# Patient Record
Sex: Female | Born: 1935 | Race: White | Hispanic: No | Marital: Married | Smoking: Never smoker
Health system: Southern US, Community
[De-identification: ages and names within clinical notes are randomized; demographics above are authoritative.]

## PROBLEM LIST (undated history)

## (undated) DIAGNOSIS — I110 Hypertensive heart disease with heart failure: Secondary | ICD-10-CM

## (undated) DIAGNOSIS — F0390 Unspecified dementia without behavioral disturbance: Secondary | ICD-10-CM

## (undated) DIAGNOSIS — K219 Gastro-esophageal reflux disease without esophagitis: Secondary | ICD-10-CM

## (undated) DIAGNOSIS — I10 Essential (primary) hypertension: Secondary | ICD-10-CM

## (undated) DIAGNOSIS — G629 Polyneuropathy, unspecified: Secondary | ICD-10-CM

## (undated) DIAGNOSIS — J9601 Acute respiratory failure with hypoxia: Secondary | ICD-10-CM

## (undated) DIAGNOSIS — M810 Age-related osteoporosis without current pathological fracture: Secondary | ICD-10-CM

## (undated) DIAGNOSIS — E7800 Pure hypercholesterolemia, unspecified: Secondary | ICD-10-CM

## (undated) DIAGNOSIS — F329 Major depressive disorder, single episode, unspecified: Secondary | ICD-10-CM

## (undated) DIAGNOSIS — G8929 Other chronic pain: Secondary | ICD-10-CM

## (undated) DIAGNOSIS — F419 Anxiety disorder, unspecified: Secondary | ICD-10-CM

## (undated) DIAGNOSIS — M545 Low back pain: Secondary | ICD-10-CM

## (undated) DIAGNOSIS — D649 Anemia, unspecified: Secondary | ICD-10-CM

## (undated) DIAGNOSIS — J309 Allergic rhinitis, unspecified: Secondary | ICD-10-CM

## (undated) DIAGNOSIS — J189 Pneumonia, unspecified organism: Secondary | ICD-10-CM

## (undated) DIAGNOSIS — S32050A Wedge compression fracture of fifth lumbar vertebra, initial encounter for closed fracture: Secondary | ICD-10-CM

## (undated) DIAGNOSIS — I5020 Unspecified systolic (congestive) heart failure: Secondary | ICD-10-CM

## (undated) HISTORY — DX: Polyneuropathy, unspecified: G62.9

## (undated) HISTORY — DX: Allergic rhinitis, unspecified: J30.9

## (undated) HISTORY — DX: Age-related osteoporosis without current pathological fracture: M81.0

## (undated) HISTORY — DX: Unspecified dementia without behavioral disturbance: F03.90

## (undated) HISTORY — DX: Acute respiratory failure with hypoxia: J96.01

## (undated) HISTORY — DX: Pure hypercholesterolemia, unspecified: E78.00

## (undated) HISTORY — DX: Other chronic pain: G89.29

## (undated) HISTORY — DX: Pneumonia, unspecified organism: J18.9

## (undated) HISTORY — DX: Wedge compression fracture of fifth lumbar vertebra, initial encounter for closed fracture: S32.050A

## (undated) HISTORY — DX: Unspecified dementia, unspecified severity, without behavioral disturbance, psychotic disturbance, mood disturbance, and anxiety: F03.90

## (undated) HISTORY — DX: Gastro-esophageal reflux disease without esophagitis: K21.9

## (undated) HISTORY — DX: Anxiety disorder, unspecified: F41.9

## (undated) HISTORY — DX: Hypertensive heart disease with heart failure: I11.0

## (undated) HISTORY — DX: Low back pain: M54.5

## (undated) HISTORY — DX: Unspecified systolic (congestive) heart failure: I50.20

## (undated) HISTORY — DX: Anemia, unspecified: D64.9

## (undated) HISTORY — DX: Essential (primary) hypertension: I10

## (undated) HISTORY — DX: Major depressive disorder, single episode, unspecified: F32.9

---

## 2006-11-30 ENCOUNTER — Observation Stay (HOSPITAL_COMMUNITY): Admission: RE | Admit: 2006-11-30 | Discharge: 2006-12-01 | Payer: Self-pay | Admitting: Neurological Surgery

## 2007-01-02 ENCOUNTER — Encounter: Admission: RE | Admit: 2007-01-02 | Discharge: 2007-01-02 | Payer: Self-pay | Admitting: Neurological Surgery

## 2011-01-28 ENCOUNTER — Ambulatory Visit
Admission: RE | Admit: 2011-01-28 | Discharge: 2011-01-28 | Disposition: A | Discharge status: Home / Self Care | Payer: Medicare Other | Source: Ambulatory Visit | Attending: Neurological Surgery | Admitting: Neurological Surgery

## 2011-01-28 ENCOUNTER — Other Ambulatory Visit: Payer: Self-pay | Admitting: Neurological Surgery

## 2011-01-28 DIAGNOSIS — M542 Cervicalgia: Secondary | ICD-10-CM

## 2017-07-01 LAB — HEPATIC FUNCTION PANEL
ALK PHOS: 91 (ref 25–125)
ALT: 20 (ref 7–35)
ALT: 20 (ref 7–35)
AST: 21 (ref 13–35)
AST: 21 (ref 13–35)
Alkaline Phosphatase: 91 (ref 25–125)
Bilirubin, Total: 0.7
Bilirubin, Total: 0.7

## 2017-07-03 LAB — BASIC METABOLIC PANEL
BUN: 26 — AB (ref 4–21)
BUN: 26 — AB (ref 4–21)
CREATININE: 0.8 (ref 0.5–1.1)
CREATININE: 0.8 (ref 0.5–1.1)
POTASSIUM: 3.9 (ref 3.4–5.3)
Potassium: 3.9 (ref 3.4–5.3)
SODIUM: 140 (ref 137–147)
Sodium: 140 (ref 137–147)

## 2017-07-04 LAB — CBC AND DIFFERENTIAL
HEMATOCRIT: 39 (ref 36–46)
HEMATOCRIT: 39 (ref 36–46)
Hemoglobin: 12.6 (ref 12.0–16.0)
Hemoglobin: 12.6 (ref 12.0–16.0)
PLATELETS: 268 (ref 150–399)
Platelets: 268 (ref 150–399)
WBC: 12.9
WBC: 12.9

## 2017-07-08 ENCOUNTER — Non-Acute Institutional Stay (SKILLED_NURSING_FACILITY): Payer: Medicare Other | Admitting: Internal Medicine

## 2017-07-08 ENCOUNTER — Encounter: Payer: Self-pay | Admitting: Internal Medicine

## 2017-07-08 DIAGNOSIS — M545 Low back pain, unspecified: Secondary | ICD-10-CM | POA: Insufficient documentation

## 2017-07-08 DIAGNOSIS — J9601 Acute respiratory failure with hypoxia: Secondary | ICD-10-CM

## 2017-07-08 DIAGNOSIS — M810 Age-related osteoporosis without current pathological fracture: Secondary | ICD-10-CM

## 2017-07-08 DIAGNOSIS — F32A Depression, unspecified: Secondary | ICD-10-CM | POA: Insufficient documentation

## 2017-07-08 DIAGNOSIS — F329 Major depressive disorder, single episode, unspecified: Secondary | ICD-10-CM

## 2017-07-08 DIAGNOSIS — G629 Polyneuropathy, unspecified: Secondary | ICD-10-CM | POA: Insufficient documentation

## 2017-07-08 DIAGNOSIS — G8929 Other chronic pain: Secondary | ICD-10-CM

## 2017-07-08 DIAGNOSIS — G301 Alzheimer's disease with late onset: Secondary | ICD-10-CM | POA: Diagnosis not present

## 2017-07-08 DIAGNOSIS — I5020 Unspecified systolic (congestive) heart failure: Secondary | ICD-10-CM | POA: Insufficient documentation

## 2017-07-08 DIAGNOSIS — I5022 Chronic systolic (congestive) heart failure: Secondary | ICD-10-CM | POA: Diagnosis not present

## 2017-07-08 DIAGNOSIS — J189 Pneumonia, unspecified organism: Secondary | ICD-10-CM

## 2017-07-08 DIAGNOSIS — I110 Hypertensive heart disease with heart failure: Secondary | ICD-10-CM

## 2017-07-08 DIAGNOSIS — J181 Lobar pneumonia, unspecified organism: Secondary | ICD-10-CM

## 2017-07-08 DIAGNOSIS — F0280 Dementia in other diseases classified elsewhere without behavioral disturbance: Secondary | ICD-10-CM

## 2017-07-08 DIAGNOSIS — F0390 Unspecified dementia without behavioral disturbance: Secondary | ICD-10-CM | POA: Insufficient documentation

## 2017-07-08 HISTORY — DX: Other chronic pain: G89.29

## 2017-07-08 HISTORY — DX: Hypertensive heart disease with heart failure: I11.0

## 2017-07-08 HISTORY — DX: Polyneuropathy, unspecified: G62.9

## 2017-07-08 HISTORY — DX: Unspecified dementia, unspecified severity, without behavioral disturbance, psychotic disturbance, mood disturbance, and anxiety: F03.90

## 2017-07-08 HISTORY — DX: Depression, unspecified: F32.A

## 2017-07-08 HISTORY — DX: Acute respiratory failure with hypoxia: J96.01

## 2017-07-08 HISTORY — DX: Pneumonia, unspecified organism: J18.9

## 2017-07-08 HISTORY — DX: Low back pain, unspecified: M54.50

## 2017-07-08 HISTORY — DX: Unspecified systolic (congestive) heart failure: I50.20

## 2017-07-08 NOTE — Progress Notes (Signed)
  Opened in error. Disregard.   This encounter was created in error - please disregard.

## 2017-07-11 ENCOUNTER — Encounter: Payer: Self-pay | Admitting: Internal Medicine

## 2017-07-11 NOTE — Progress Notes (Signed)
Provider:  Margit Hanks M.D. Location:  [facility]   Place of Service:  SNF (31)  PCP: No primary care provider on file. No care team member to display  Extended Emergency Contact Information Primary Emergency Contact: Burnadette [place] Mobile Phone: [phone] Relation: Spouse Secondary Emergency Contact: Burby,Chris  [place] Mobile Phone: [phone] Relation: Daughter     Allergies: Codeine; Morphine and related; Pravachol [pravastatin sodium]; Risperdal [risperidone]; and Tramadol  Chief Complaint  Patient presents with  . New Admit To SNF    Admit to Facility    HPI: Patient is 83 y.o. female with advanced dementia, hypertension, hyperlipidemia, who lives with her family at home. Before presentation to [facility] ED patient Began complaining of shortness of breath and has had subjective fevers. Her oxygenation was checked and found to be less than 90. Patient continued to have shortness of breath during the night, screaming all night as reported by family and having subjective fever. She was brought to the emergency department for continued symptoms. In the ED patient was found to have bilateral basal opacities suggestive of pneumonia and she was started on pneumonia protocol. Per husband who gave a history patient did not complain of any chest pain or cough but did complain of shortness of breath. She denied any palpitations abdominal pain dysuria nausea vomiting or diarrhea. Patient was admitted to [facility] from 8/10-15 for pneumonia treated with Zithromax and Rocephin. Hospital course was, graded about possible systolic congestive heart failure and patient was started on heart failure regimen with improvement. Patient also was found to have dysphasia to be treated with soft diet and nectar thick liquid. Patient had a chronic left heel ulcer stage III and it was not infected.  Patient continued to have leukocytosis despite treatment. However all other parameters improved. Patient is admitted to skilled nursing facility for generalized weakness for OT/PT. While at skilled nursing facility patient will be treated for hyperlipidemia and treated with Lipitor, dementia treated with Aricept, and polyneuropathy treated with Neurontin.  Past Medical History:  Diagnosis Date  . Allergic rhinitis   . Anemia   . Anxiety   . Compression fracture of fifth lumbar vertebra (HCC)   . Dementia   . GERD (gastroesophageal reflux disease)   . Hypercholesteremia   . Hypertension   . Osteoporosis     History reviewed. No pertinent surgical history.  Allergies as of 07/08/2017      Reactions   Codeine    Morphine And Related    Pravachol [pravastatin Sodium]    Risperdal [risperidone]    Tramadol       Medication List       Accurate as of 07/08/17 11:59 PM. Always use your most recent med list.          atorvastatin 20 MG tablet Commonly known as:  LIPITOR Take 20 mg by mouth daily.   bisacodyl 10 MG suppository Commonly known as:  DULCOLAX Place 10 mg rectally daily as needed.   carbamide peroxide 6.5 % OTIC solution Commonly known as:  DEBROX Place 3-4 drops into both ears once a week.   cefdinir 300 MG capsule Commonly known as:  OMNICEF Take 300 mg by mouth 2 (two) times daily. For 7 days   donepezil 5 MG tablet Commonly known as:  ARICEPT Take 5 mg by mouth at bedtime.   furosemide 20 MG tablet Commonly known as:  LASIX Take 40  mg by mouth daily.   gabapentin 300 MG capsule Commonly known as:  NEURONTIN Take 900 mg by mouth at bedtime. 3 caps   ibandronate 150 MG tablet Commonly known as:  BONIVA Take 150 mg by mouth every 30 (thirty) days. Take in the morning with a full glass of water, on an empty stomach, and do not take anything else by mouth or lie down for the next 30 min.   lisinopril 40 MG tablet Commonly known as:   PRINIVIL,ZESTRIL Take 40 mg by mouth daily.   metoprolol tartrate 25 MG tablet Commonly known as:  LOPRESSOR Take 25 mg by mouth 2 (two) times daily.   ondansetron 4 MG tablet Commonly known as:  ZOFRAN Take 4 mg by mouth every 12 (twelve) hours as needed for nausea.   sennosides-docusate sodium 8.6-50 MG tablet Commonly known as:  SENOKOT-S Take 1 tablet by mouth daily.   venlafaxine 37.5 MG tablet Commonly known as:  EFFEXOR Take 37.5 mg by mouth daily.       Meds ordered this encounter  Medications  . bisacodyl (DULCOLAX) 10 MG suppository    Sig: Place 10 mg rectally daily as needed.  . cefdinir (OMNICEF) 300 MG capsule    Sig: Take 300 mg by mouth 2 (two) times daily. For 7 days  . furosemide (LASIX) 20 MG tablet    Sig: Take 40 mg by mouth daily.  . metoprolol tartrate (LOPRESSOR) 25 MG tablet    Sig: Take 25 mg by mouth 2 (two) times daily.  Marland Kitchen lisinopril (PRINIVIL,ZESTRIL) 40 MG tablet    Sig: Take 40 mg by mouth daily.  Marland Kitchen atorvastatin (LIPITOR) 20 MG tablet    Sig: Take 20 mg by mouth daily.  . carbamide peroxide (DEBROX) 6.5 % OTIC solution    Sig: Place 3-4 drops into both ears once a week.  . donepezil (ARICEPT) 5 MG tablet    Sig: Take 5 mg by mouth at bedtime.  . gabapentin (NEURONTIN) 300 MG capsule    Sig: Take 900 mg by mouth at bedtime. 3 caps  . ibandronate (BONIVA) 150 MG tablet    Sig: Take 150 mg by mouth every 30 (thirty) days. Take in the morning with a full glass of water, on an empty stomach, and do not take anything else by mouth or lie down for the next 30 min.  . ondansetron (ZOFRAN) 4 MG tablet    Sig: Take 4 mg by mouth every 12 (twelve) hours as needed for nausea.  . sennosides-docusate sodium (SENOKOT-S) 8.6-50 MG tablet    Sig: Take 1 tablet by mouth daily.  Marland Kitchen venlafaxine (EFFEXOR) 37.5 MG tablet    Sig: Take 37.5 mg by mouth daily.     There is no immunization history on file for this patient.  Social History  Substance Use  Topics  . Smoking status: Never Smoker  . Smokeless tobacco: Never Used  . Alcohol use No    Family history is unknowable. During WWII patient was in a [place] detention camp as a teenager, and never saw her parents again and has had no other family other than her husband.  Family History  Problem Relation Age of Onset  . Family history unknown: Yes      Review of Systems  DATA OBTAINED: from  family member GENERAL:  no fevers, fatigue, appetite changes SKIN: No itching, or rash EYES: No eye pain, redness, discharge EARS: No earache, tinnitus, change in hearing NOSE: No congestion, drainage or  bleeding  MOUTH/THROAT: No mouth or tooth pain, No sore throat RESPIRATORY: No cough, wheezing, SOB CARDIAC: No chest pain, palpitations, lower extremity edema  GI: No abdominal pain, No N/V/D or constipation, No heartburn or reflux  GU: No dysuria, frequency or urgency, or incontinence  MUSCULOSKELETAL: Chronic low back pain at home patient takes a narcotic pain pill 3 times a day; patient has chronic left arm weakness due to prior surgery on left shoulder; patient herself says she has some low back pain down but is not really bad; OT is in the room working with patient and they were able to get her transferred from bed to wheelchair NEUROLOGIC: No headache, dizziness or focal weakness PSYCHIATRIC: No c/o anxiety or sadness   Vitals:   07/08/17 1029  BP: 111/68  Pulse: 78  Resp: 20  Temp: 97.9 F (36.6 C)    SpO2 Readings from Last 1 Encounters:  No data found for SpO2   Body mass index is 31.56 kg/m.     Physical Exam  GENERAL APPEARANCE: Alert, moderately conversant,  No acute distress.; Generalized weakness  SKIN: No diaphoresis rash HEAD: Normocephalic, atraumatic  EYES: Conjunctiva/lids clear. Pupils round, reactive. EOMs intact.  EARS: External exam WNL, canals clear. Hearing grossly normal.  NOSE: No deformity or discharge.  MOUTH/THROAT: Lips w/o lesions   RESPIRATORY: Breathing is even, unlabored. Lung sounds are clear   CARDIOVASCULAR: Heart RRR no murmurs, rubs or gallops. No peripheral edema.   GASTROINTESTINAL: Abdomen is soft, non-tender, not distended w/ normal bowel sounds. GENITOURINARY: Bladder non tender, not distended  MUSCULOSKELETAL: No abnormal joints or musculature NEUROLOGIC:  Cranial nerves 2-12 grossly intact. Moves all extremities except for left arm weakness PSYCHIATRIC: Mood and affect with dementia, no behavioral issues  Patient Active Problem List   Diagnosis Date Noted  . Community acquired pneumonia 07/08/2017  . Acute respiratory failure with hypoxia (HCC) 07/08/2017  . Systolic congestive heart failure (HCC) 07/08/2017  . Hypertensive heart disease with heart failure (HCC) 07/08/2017  . Dementia without behavioral disturbance 07/08/2017  . Polyneuropathy 07/08/2017  . Osteoporosis 07/08/2017  . Depression 07/08/2017  . Chronic low back pain 07/08/2017      Labs reviewed: Basic Metabolic Panel:    Component Value Date/Time   NA 140 07/03/2017   K 3.9 07/03/2017   BUN 26 (A) 07/03/2017   CREATININE 0.8 07/03/2017   AST 21 07/01/2017   ALT 20 07/01/2017   ALKPHOS 91 07/01/2017     Recent Labs  07/03/17  NA 140  K 3.9  BUN 26*  CREATININE 0.8   Liver Function Tests:  Recent Labs  07/01/17  AST 21  ALT 20  ALKPHOS 91   No results for input(s): LIPASE, AMYLASE in the last 8760 hours. No results for input(s): AMMONIA in the last 8760 hours. CBC:  Recent Labs  07/04/17  WBC 12.9  HGB 12.6  HCT 39  PLT 268   Lipid No results for input(s): CHOL, HDL, LDLCALC, TRIG in the last 8760 hours.  Cardiac Enzymes: No results for input(s): CKTOTAL, CKMB, CKMBINDEX, TROPONINI in the last 8760 hours. BNP: No results for input(s): BNP in the last 8760 hours. No results found for: MICROALBUR No results found for: HGBA1C No results found for: TSH No results found for: VITAMINB12 No results  found for: FOLATE No results found for: IRON, TIBC, FERRITIN  Imaging and Procedures obtained prior to SNF admission: Patient was never admitted.   Not all labs, radiology exams or other studies  done during hospitalization come through on my EPIC note; however they are reviewed by me.    Assessment and Plan  ACUTE RESPIRATORY FAILURE WITH HYPOXIA/COMMUNITY ACQUIRED PNEUMONIA-chest x-ray showed mild basal opacities with symmetry bilaterally could not rule out pneumonia, also could be edema. Patient was desaturating and also had subjective fever at home and leukocytosis. Patient was treated with Rocephin and Zithromax and blood cultures were negative; a discharge patient is still having some leukocytosis SNF - patient is admitted to skilled nursing for generalized weakness for OT/PT/ST; will continue Omnicef 300 mg twice a day for 1 more day; patient is currently not requiring O2  SYSTOLIC CONGESTIVE heart failure-no documented history of CHF; echo showed mild left ventricular dysfunction with low ejection fraction of 45-50% patient was started on Lasix and metoprolol with improvement SNF - plan to continue Lasix 40 mg daily and metoprolol 25 mg twice a day  HYPERTENSION-blood pressure was noted to be a little bit high so lisinopril was increased to 40 mg SNF - will continue lisinopril 40 mg by mouth daily Lasix 40 mg by mouth daily and metoprolol 25 mg by mouth twice a day  HYPERLIPIDEMIA SNF -plan to continue Lipitor 20 mg daily  DEMENTIA without behaviors SNF - appears stable; continue Aricept 5 mg by mouth daily at bedtime  POLYNEUROPATHY SNF - no complaints of nerve or leg pain; plan to continue Neurontin 900 mg by mouth daily at bedtime  OSTEOPOROSIS SNF - plan to continue Boniva 150 mg by mouth every 30 days  DEPRESSION SNF - appears stable; plan to continue Effexor 37.5 mg by mouth daily  CHRONIC LOW BACK PAIN SNF - according to records patient was on Lortab 10/325 every 6  when necessary at home; Zocor given to daughter patient was on some sort of narcotic pain medicine 3 times a day; patient doesn't look in extreme pain and does not profess to extreme low back pain today; will start Norco 5/325 one by mouth every 8 hours and follow response   Time spent greater than 45 minutes;> 50% of time with patient was spent reviewing records, labs, tests and studies, counseling and developing plan of care  Thurston Hole D. Lyn Hollingshead, MD

## 2017-07-12 ENCOUNTER — Encounter (HOSPITAL_COMMUNITY): Payer: Self-pay

## 2017-07-12 ENCOUNTER — Emergency Department (HOSPITAL_COMMUNITY): Payer: Medicare Other

## 2017-07-12 ENCOUNTER — Emergency Department (HOSPITAL_COMMUNITY)
Admission: EM | Admit: 2017-07-12 | Discharge: 2017-07-12 | Disposition: A | Discharge status: Home / Self Care | Payer: Medicare Other | Attending: Emergency Medicine | Admitting: Emergency Medicine

## 2017-07-12 DIAGNOSIS — Y92121 Bathroom in nursing home as the place of occurrence of the external cause: Secondary | ICD-10-CM | POA: Insufficient documentation

## 2017-07-12 DIAGNOSIS — S0093XA Contusion of unspecified part of head, initial encounter: Secondary | ICD-10-CM

## 2017-07-12 DIAGNOSIS — E7800 Pure hypercholesterolemia, unspecified: Secondary | ICD-10-CM | POA: Insufficient documentation

## 2017-07-12 DIAGNOSIS — Z79899 Other long term (current) drug therapy: Secondary | ICD-10-CM | POA: Insufficient documentation

## 2017-07-12 DIAGNOSIS — W19XXXA Unspecified fall, initial encounter: Secondary | ICD-10-CM | POA: Insufficient documentation

## 2017-07-12 DIAGNOSIS — Y998 Other external cause status: Secondary | ICD-10-CM | POA: Diagnosis not present

## 2017-07-12 DIAGNOSIS — Z23 Encounter for immunization: Secondary | ICD-10-CM | POA: Diagnosis not present

## 2017-07-12 DIAGNOSIS — Y939 Activity, unspecified: Secondary | ICD-10-CM | POA: Insufficient documentation

## 2017-07-12 DIAGNOSIS — I10 Essential (primary) hypertension: Secondary | ICD-10-CM | POA: Insufficient documentation

## 2017-07-12 DIAGNOSIS — S0990XA Unspecified injury of head, initial encounter: Secondary | ICD-10-CM | POA: Diagnosis present

## 2017-07-12 MED ORDER — ACETAMINOPHEN 325 MG PO TABS
650.0000 mg | ORAL_TABLET | Freq: Once | ORAL | Status: AC
  Administered 2017-07-12: 650 mg via ORAL
  Filled 2017-07-12: qty 2

## 2017-07-12 MED ORDER — TETANUS-DIPHTH-ACELL PERTUSSIS 5-2.5-18.5 LF-MCG/0.5 IM SUSP
0.5000 mL | Freq: Once | INTRAMUSCULAR | Status: AC
  Administered 2017-07-12: 0.5 mL via INTRAMUSCULAR
  Filled 2017-07-12: qty 0.5

## 2017-07-12 NOTE — ED Provider Notes (Signed)
[facility]-EMERGENCY DEPT Provider Note   CSN: [id number] Arrival date & time: 07/12/17  1229     History   Chief Complaint Chief Complaint  Patient presents with  . Fall    HPI   Blood pressure (!) 174/79, pulse 68, temperature 99.1 F (37.3 C), resp. rate 16, height 5\' 3"  (1.6 m), weight 80.7 kg (178 lb), SpO2 94 %.  Sheena Perkins is a 83 y.o. female brought in by EMS from rehabilitation facility (pneumonia) status post fall. She was in the restroom, when care worker returned 10 minutes later she was found face down on the floor, patient was conscious, she has baseline dementia, she is mentating at her baseline as per EMS. She is not anticoagulated. Patient is reporting headache, she denies chest pain, abdominal pain or difficulty moving major joints. Level V caveat secondary to dementia.   Past Medical History:  Diagnosis Date  . Allergic rhinitis   . Anemia   . Anxiety   . Compression fracture of fifth lumbar vertebra (HCC)   . Dementia   . GERD (gastroesophageal reflux disease)   . Hypercholesterolemia   . Hypertension   . Osteoporosis     Patient Active Problem List   Diagnosis Date Noted  . Community acquired pneumonia 07/08/2017  . Acute respiratory failure with hypoxia (HCC) 07/08/2017  . Systolic congestive heart failure (HCC) 07/08/2017  . Hypertensive heart disease with heart failure (HCC) 07/08/2017  . Dementia without behavioral disturbance 07/08/2017  . Polyneuropathy 07/08/2017  . Osteoporosis 07/08/2017  . Depression 07/08/2017  . Chronic low back pain 07/08/2017    History reviewed. No pertinent surgical history.  OB History    No data available       Home Medications    Prior to Admission medications   Medication Sig Start Date End Date Taking? Authorizing Provider  atorvastatin (LIPITOR) 20 MG tablet Take 20 mg by mouth daily.    [provider]  bisacodyl (DULCOLAX) 10 MG suppository Place 10 mg rectally as needed.    [provider]  carbamide peroxide (DEBROX) 6.5 % OTIC solution Place 3-4 drops into both ears once a week.    [provider]  cefdinir (OMNICEF) 300 MG capsule Take 300 mg by mouth 2 (two) times daily. For 7 days    [provider]  donepezil (ARICEPT) 5 MG tablet Take 5 mg by mouth at bedtime.    [provider]  furosemide (LASIX) 20 MG tablet Take 40 mg by mouth daily.    [provider]  gabapentin (NEURONTIN) 300 MG capsule Take 900 mg by mouth at bedtime. 3 caps    [provider]  ibandronate (BONIVA) 150 MG tablet Take 150 mg by mouth every 30 (thirty) days. Take in the morning with a full glass of water, on an empty stomach, and do not take anything else by mouth or lie down for the next 30 min.    [provider]  lisinopril (PRINIVIL,ZESTRIL) 40 MG tablet Take 40 mg by mouth daily.    [provider]  metoprolol tartrate (LOPRESSOR) 25 MG tablet Take 25 mg by mouth 2 (two) times daily.    [provider]  ondansetron (ZOFRAN) 4 MG tablet Take 4 mg by mouth every 12 (twelve) hours as needed for nausea.    [provider]  sennosides-docusate sodium (SENOKOT-S) 8.6-50 MG tablet Take 1 tablet by mouth daily.    [provider]  venlafaxine (EFFEXOR) 37.5 MG tablet Take 37.5  mg by mouth daily.    [provider]    Family History Family History  Problem Relation Age of Onset  . Family history unknown: Yes    Social History Social History  Substance Use Topics  . Smoking status: Never Smoker  . Smokeless tobacco: Never Used  . Alcohol use No     Allergies   Morphine and related; Pravastatin sodium; Tramadol; Codeine; and Risedronate   Review of Systems Review of Systems  A complete review of systems was obtained and all systems are negative except as noted in the HPI and PMH.   Physical Exam Updated Vital Signs BP (!) 172/73 (BP Location: Left Arm)   Pulse 70   Temp  99.1 F (37.3 C)   Resp 16   Ht 5\' 3"  (1.6 m)   Wt 80.7 kg (178 lb)   SpO2 96%   BMI 31.53 kg/m   Physical Exam  Constitutional: She appears well-developed and well-nourished. No distress.  HENT:  Head: Normocephalic.  Mouth/Throat: Oropharynx is clear and moist.  Contusion to right frontal forehead with partial-thickness laceration, extraocular movement is intact, no epistaxis, no intraoral trauma.  Eyes: Pupils are equal, round, and reactive to light. Conjunctivae and EOM are normal.  Neck: Normal range of motion.  No midline C-spine  tenderness to palpation or step-offs appreciated. Patient has full range of motion without pain.  Grip strength, biceps, triceps 5/5 bilaterally;  can differentiate between pinprick and light touch bilaterally.   Cardiovascular: Normal rate, regular rhythm and intact distal pulses.  Exam reveals no gallop and no friction rub.   No murmur heard. Pulmonary/Chest: Effort normal and breath sounds normal. No respiratory distress. She has no wheezes. She has no rales. She exhibits no tenderness.  Abdominal: Soft. She exhibits no distension and no mass. There is no tenderness. There is no rebound and no guarding. No hernia.  Musculoskeletal: Normal range of motion. She exhibits tenderness.  Patient is reporting pain to bilateral wrists, no focal snuffbox tenderness bilaterally. Grip strength equal, distally neurovascularly intact.  Neurological: She is alert.  Oriented to person and place but not time. Moving all extremities, pupils equal round reactive  Skin: Capillary refill takes less than 2 seconds. She is not diaphoretic.  Psychiatric: She has a normal mood and affect.  Nursing note and vitals reviewed.    ED Treatments / Results  Labs (all labs ordered are listed, but only abnormal results are displayed) Labs Reviewed - No data to display  EKG  EKG Interpretation None       Radiology Dg Wrist Complete Left  Result Date:  07/12/2017 CLINICAL DATA:  Fall. EXAM: LEFT WRIST - COMPLETE 3+ VIEW COMPARISON:  None. FINDINGS: There is no evidence of fracture or dislocation. Mild degenerative changes of the triscaphe and first CMC joints. Osteopenia. Soft tissues are unremarkable. IMPRESSION: No acute fracture or malalignment. If there is continued clinical concern for occult scaphoid fracture, recommend follow up x-rays in 10-14 days. Electronically Signed   By: Obie Dredge M.D.   On: 07/12/2017 14:21   Dg Wrist Complete Right  Result Date: 07/12/2017 CLINICAL DATA:  Fall. EXAM: RIGHT WRIST - COMPLETE 3+ VIEW COMPARISON:  None. FINDINGS: There is no evidence of fracture or dislocation. Mild degenerative changes of the triscaphe and first CMC joints. Osteopenia. Soft tissues are unremarkable. IMPRESSION: No acute fracture or malalignment. If there is continued clinical concern for occult scaphoid fracture, recommend follow up x-rays in 10-14 days. Electronically Signed  By: Obie Dredge M.D.   On: 07/12/2017 14:19   Ct Head Wo Contrast  Result Date: 07/12/2017 CLINICAL DATA:  Pain following fall EXAM: CT HEAD WITHOUT CONTRAST CT CERVICAL SPINE WITHOUT CONTRAST TECHNIQUE: Multidetector CT imaging of the head and cervical spine was performed following the standard protocol without intravenous contrast. Multiplanar CT image reconstructions of the cervical spine were also generated. COMPARISON:  None. FINDINGS: CT HEAD FINDINGS Brain: There is moderate diffuse atrophy. There is no intracranial mass, hemorrhage, extra-axial fluid collection, or midline shift. There is small vessel disease throughout the centra semiovale bilaterally. No acute infarct evident. Vascular: There is no hyperdense vessel. There is calcification in both carotid siphon regions. Skull: Bony calvarium appears intact. There is a right frontal scalp hematoma. Sinuses/Orbits: There is opacification throughout much of the right maxillary antrum with calcification  within the opacification in the right maxillary antrum. There is mucosal thickening in multiple ethmoid air cells. There is opacification in anterior left ethmoid air cell. Other paranasal sinuses are clear. Orbits appear symmetric bilaterally. Other: Mastoid air cells are clear. There is debris in the right external auditory canal. CT CERVICAL SPINE FINDINGS Alignment: There is 1 mm of anterolisthesis is C4 on C5. There is 3 mm of anterolisthesis of C5 on C6. There is 1 mm of retrolisthesis of C6 on C7. Skull base and vertebrae: Skull base and craniocervical junction regions appear normal. There is anterior fusion at C3 and C4 with support hardware intact. There is no appreciable fracture. There are no blastic or lytic bone lesions. Soft tissues and spinal canal: The prevertebral soft tissues and predental space regions are within normal limits. There are no paraspinous lesions. No cord or canal hematoma is evident. Disc levels: There is a disc spacer at C3-4. There is marked disc space narrowing at C6-7 and T1-2. There is moderate disc space narrowing at C4-5 and C5-6. There is milder disc space narrowing at C2-3. There are anterior osteophytes at all levels. There is facet osteoarthritic change to varying degrees at all levels bilaterally. There is moderate exit foraminal narrowing due to bony hypertrophy at C3-4 on the right,, at C5-6 on the left, and at C6-7 on the right. No frank disc extrusion. There is moderate spinal stenosis at C6-7 due to bony hypertrophy. Upper chest: Visualized lung regions are clear. There is aortic atherosclerosis. Other: None IMPRESSION: CT head: 1. Moderate diffuse atrophy with small vessel disease throughout the periventricular white matter. No intracranial mass, hemorrhage, or extra-axial fluid collection. No evident acute infarct. 2.  Areas of arterial vascular calcification. 3. Paranasal sinus disease, most notably in the right maxillary antrum. Calcification in this area of  opacity in the right maxillary antrum suggests chronicity, likely with fungal colonization. 4.  Probable cerumen in the right external auditory canal. 5.  Right frontal scalp hematoma. CT cervical spine: No fracture. Areas of mild spondylolisthesis are felt to be due to underlying spondylosis. There is multilevel arthropathy. There is a degree of spinal stenosis at C6-7 due to bony hypertrophy. Postoperative change noted anteriorly at C3 and C4. There is aortic atherosclerosis. Aortic Atherosclerosis (ICD10-I70.0). Electronically Signed   By: Bretta Bang III M.D.   On: 07/12/2017 14:33   Ct Cervical Spine Wo Contrast  Result Date: 07/12/2017 CLINICAL DATA:  Pain following fall EXAM: CT HEAD WITHOUT CONTRAST CT CERVICAL SPINE WITHOUT CONTRAST TECHNIQUE: Multidetector CT imaging of the head and cervical spine was performed following the standard protocol without intravenous contrast. Multiplanar CT image reconstructions  of the cervical spine were also generated. COMPARISON:  None. FINDINGS: CT HEAD FINDINGS Brain: There is moderate diffuse atrophy. There is no intracranial mass, hemorrhage, extra-axial fluid collection, or midline shift. There is small vessel disease throughout the centra semiovale bilaterally. No acute infarct evident. Vascular: There is no hyperdense vessel. There is calcification in both carotid siphon regions. Skull: Bony calvarium appears intact. There is a right frontal scalp hematoma. Sinuses/Orbits: There is opacification throughout much of the right maxillary antrum with calcification within the opacification in the right maxillary antrum. There is mucosal thickening in multiple ethmoid air cells. There is opacification in anterior left ethmoid air cell. Other paranasal sinuses are clear. Orbits appear symmetric bilaterally. Other: Mastoid air cells are clear. There is debris in the right external auditory canal. CT CERVICAL SPINE FINDINGS Alignment: There is 1 mm of anterolisthesis  is C4 on C5. There is 3 mm of anterolisthesis of C5 on C6. There is 1 mm of retrolisthesis of C6 on C7. Skull base and vertebrae: Skull base and craniocervical junction regions appear normal. There is anterior fusion at C3 and C4 with support hardware intact. There is no appreciable fracture. There are no blastic or lytic bone lesions. Soft tissues and spinal canal: The prevertebral soft tissues and predental space regions are within normal limits. There are no paraspinous lesions. No cord or canal hematoma is evident. Disc levels: There is a disc spacer at C3-4. There is marked disc space narrowing at C6-7 and T1-2. There is moderate disc space narrowing at C4-5 and C5-6. There is milder disc space narrowing at C2-3. There are anterior osteophytes at all levels. There is facet osteoarthritic change to varying degrees at all levels bilaterally. There is moderate exit foraminal narrowing due to bony hypertrophy at C3-4 on the right,, at C5-6 on the left, and at C6-7 on the right. No frank disc extrusion. There is moderate spinal stenosis at C6-7 due to bony hypertrophy. Upper chest: Visualized lung regions are clear. There is aortic atherosclerosis. Other: None IMPRESSION: CT head: 1. Moderate diffuse atrophy with small vessel disease throughout the periventricular white matter. No intracranial mass, hemorrhage, or extra-axial fluid collection. No evident acute infarct. 2.  Areas of arterial vascular calcification. 3. Paranasal sinus disease, most notably in the right maxillary antrum. Calcification in this area of opacity in the right maxillary antrum suggests chronicity, likely with fungal colonization. 4.  Probable cerumen in the right external auditory canal. 5.  Right frontal scalp hematoma. CT cervical spine: No fracture. Areas of mild spondylolisthesis are felt to be due to underlying spondylosis. There is multilevel arthropathy. There is a degree of spinal stenosis at C6-7 due to bony hypertrophy.  Postoperative change noted anteriorly at C3 and C4. There is aortic atherosclerosis. Aortic Atherosclerosis (ICD10-I70.0). Electronically Signed   By: Bretta Bang III M.D.   On: 07/12/2017 14:33   Dg Hips Bilat W Or Wo Pelvis 3-4 Views  Result Date: 07/12/2017 CLINICAL DATA:  Bilateral hip pain after falling at home today. EXAM: DG HIP (WITH OR WITHOUT PELVIS) 3-4V BILAT COMPARISON:  Lower abdomen and pelvis radiograph from an acute abdominal series dated April 22, 2017. FINDINGS: The bones are subjectively osteopenic. No acute pelvic fracture is observed. On the right the patient has undergone previous ORIF for a femoral neck fracture. Shortening of the femoral neck is observed. No acute fracture is demonstrated. There is mild fairly symmetric narrowing of the right hip joint space. On the left there is mild-to-moderate of the  hip joint space. The articular surfaces of the femoral head and acetabulum remains smoothly rounded. The left femoral neck, intertrochanteric, and subtrochanteric regions are normal. IMPRESSION: Chronic post traumatic and post ORIF changes of the right femur. Moderate degenerative narrowing of the hip joint spaces. No acute fracture nor dislocation is observed. Electronically Signed   By: David  [place] M.D.   On: 07/12/2017 14:18    Procedures Procedures (including critical care time)  Medications Ordered in ED Medications  Tdap (BOOSTRIX) injection 0.5 mL (0.5 mLs Intramuscular Given 07/12/17 1325)  acetaminophen (TYLENOL) tablet 650 mg (650 mg Oral Given 07/12/17 1325)     Initial Impression / Assessment and Plan / ED Course  I have reviewed the triage vital signs and the nursing notes.  Pertinent labs & imaging results that were available during my care of the patient were reviewed by me and considered in my medical decision making (see chart for details).     Vitals:   07/12/17 1315 07/12/17 1319 07/12/17 1330 07/12/17 1436  BP: (!) 174/79  (!) 167/83 (!)  172/73  Pulse: 68  76 70  Resp:    16  Temp:  99.1 F (37.3 C)    TempSrc:      SpO2: 94%  99% 96%  Weight:      Height:        Medications  Tdap (BOOSTRIX) injection 0.5 mL (0.5 mLs Intramuscular Given 07/12/17 1325)  acetaminophen (TYLENOL) tablet 650 mg (650 mg Oral Given 07/12/17 1325)    Tessa Laury is 83 y.o. female presenting with Frontal head contusion status post fall, grossly nonfocal neurologic exam, plan CT head, C-spine, plain films of bilateral wrist and hips. Tetanus updated, Tylenol for pain.  Imaging with no acute findings.   Evaluation does not show pathology that would require ongoing emergent intervention or inpatient treatment. Pt is hemodynamically stable and mentating appropriately. Discussed findings and plan with patient/guardian, who agrees with care plan. All questions answered. Return precautions discussed and outpatient follow up given.    Final Clinical Impressions(s) / ED Diagnoses   Final diagnoses:  Fall, initial encounter  Contusion of head, unspecified part of head, initial encounter    New Prescriptions New Prescriptions   No medications on file     Kaylyn Lim 07/12/17 1501    Jacalyn Lefevre, MD 07/12/17 [phone]

## 2017-07-12 NOTE — ED Notes (Signed)
Pt incontinent of stool; peri care done and new brief applied

## 2017-07-12 NOTE — ED Notes (Signed)
Cleaned and applied bacitracin and gauze to wound on R forehead

## 2017-07-12 NOTE — Discharge Instructions (Signed)
Wash the affected area with soap and water and apply a thin layer of topical antibiotic ointment. Do this every 12 hours.  ° °Do not use rubbing alcohol or hydrogen peroxide.                       ° °Look for signs of infection: if you see redness, if the area becomes warm, if pain increases sharply, there is discharge (pus), if red streaks appear or you develop fever or vomiting, RETURN immediately to the Emergency Department  for a recheck.  ° °Please follow with your primary care doctor in the next 2 days for a check-up. They must obtain records for further management.  ° °Do not hesitate to return to the Emergency Department for any new, worsening or concerning symptoms.  ° °

## 2017-07-12 NOTE — ED Notes (Signed)
[facility] called for Patient

## 2017-07-12 NOTE — ED Notes (Signed)
Pt taken to CT and Xray

## 2017-07-12 NOTE — ED Triage Notes (Signed)
Per [facility], Pt is coming from [facility] for Pneumonia. Family was with pt this morning when approximately 10 minutes after they left, pt was found down in the bathroom face down on the floor. Pt has left thumb pain, abdominal pain, hematoma and abrasion to the right forehead and right wrist. Pt is alert to self, place, but not time per baseline. Hx of Dementia, HTN, Chronic Back Pain. No blood thinners. Vitals per EMS; 174/80, 70 HR, 16 RR, 96% on RA

## 2017-07-22 ENCOUNTER — Non-Acute Institutional Stay (SKILLED_NURSING_FACILITY): Payer: Medicare Other | Admitting: Internal Medicine

## 2017-07-22 ENCOUNTER — Encounter: Payer: Self-pay | Admitting: Internal Medicine

## 2017-07-22 DIAGNOSIS — G629 Polyneuropathy, unspecified: Secondary | ICD-10-CM

## 2017-07-22 DIAGNOSIS — M545 Low back pain, unspecified: Secondary | ICD-10-CM

## 2017-07-22 DIAGNOSIS — J181 Lobar pneumonia, unspecified organism: Secondary | ICD-10-CM

## 2017-07-22 DIAGNOSIS — J9601 Acute respiratory failure with hypoxia: Secondary | ICD-10-CM | POA: Diagnosis not present

## 2017-07-22 DIAGNOSIS — M810 Age-related osteoporosis without current pathological fracture: Secondary | ICD-10-CM

## 2017-07-22 DIAGNOSIS — I5022 Chronic systolic (congestive) heart failure: Secondary | ICD-10-CM | POA: Diagnosis not present

## 2017-07-22 DIAGNOSIS — F0280 Dementia in other diseases classified elsewhere without behavioral disturbance: Secondary | ICD-10-CM

## 2017-07-22 DIAGNOSIS — G301 Alzheimer's disease with late onset: Secondary | ICD-10-CM

## 2017-07-22 DIAGNOSIS — F32A Depression, unspecified: Secondary | ICD-10-CM

## 2017-07-22 DIAGNOSIS — E785 Hyperlipidemia, unspecified: Secondary | ICD-10-CM

## 2017-07-22 DIAGNOSIS — G8929 Other chronic pain: Secondary | ICD-10-CM

## 2017-07-22 DIAGNOSIS — I110 Hypertensive heart disease with heart failure: Secondary | ICD-10-CM | POA: Diagnosis not present

## 2017-07-22 DIAGNOSIS — J189 Pneumonia, unspecified organism: Secondary | ICD-10-CM

## 2017-07-22 DIAGNOSIS — E1169 Type 2 diabetes mellitus with other specified complication: Secondary | ICD-10-CM | POA: Diagnosis not present

## 2017-07-22 DIAGNOSIS — F329 Major depressive disorder, single episode, unspecified: Secondary | ICD-10-CM

## 2017-07-22 NOTE — Progress Notes (Signed)
Location:  [facility] Room Number: 515 Place of Service:  SNF (31)  PCP: Margit Hanks, MD Patient Care Team: Margit Hanks, MD as PCP - General (Internal Medicine)  Extended Emergency Contact Information Primary Emergency Contact: Garrido,Francisco Address: [address]          [place] [address] [place] Home Phone: [phone] Relation: Spouse  Allergies  Allergen Reactions  . Morphine And Related Nausea And Vomiting  . Pravastatin Sodium     Angina  . Tramadol     dizziness  . Codeine Rash  . Risedronate Rash    Chief Complaint  Patient presents with  . Discharge Note    discharge from SNF to home    HPI:  83 y.o. female  with advanced dementia, hypertension, hyperlipidemia, who lives at home with her family, who was admitted to [facility] from 8/10-15 for pneumonia. Hospital course was complicated with possible systolic congestive heart failure and patient was started on heart failure regimen with improvement. Patient was also found to have dysphagia. Patient had a chronic left heel ulcer stage III which was not effective. Patient was admitted to skilled nursing facility with generalized weakness for OT/ PT and patient is now ready to be DC to home.    Past Medical History:  Diagnosis Date  . Acute respiratory failure with hypoxia (HCC) 07/08/2017  . Allergic rhinitis   . Anemia   . Anxiety   . Chronic low back pain 07/08/2017  . Community acquired pneumonia 07/08/2017  . Compression fracture of fifth lumbar vertebra (HCC)   . Dementia   . Dementia without behavioral disturbance 07/08/2017  . Depression 07/08/2017  . GERD (gastroesophageal reflux disease)   . Hypercholesterolemia   . Hypertension   . Hypertensive heart disease with heart failure (HCC) 07/08/2017  . Osteoporosis   . Polyneuropathy 07/08/2017  . Systolic congestive heart failure (HCC) 07/08/2017    History reviewed. No  pertinent surgical history.   reports that she has never smoked. She has never used smokeless tobacco. She reports that she does not drink alcohol or use drugs. Social History   Social History  . Marital status: Married    Spouse name: N/A  . Number of children: 1  . Years of education: N/A   Occupational History  . Not on file.   Social History Main Topics  . Smoking status: Never Smoker  . Smokeless tobacco: Never Used  . Alcohol use No  . Drug use: No  . Sexual activity: No   Other Topics Concern  . Not on file   Social History Narrative   ** Merged History Encounter **        Pertinent  Health Maintenance Due  Topic Date Due  . HEMOGLOBIN A1C  [birth year]24[birth year]  . FOOT EXAM  09/25/1934  . OPHTHALMOLOGY EXAM  09/25/1934  . DEXA SCAN  09/25/1989  . PNA vac Low Risk Adult (1 of 2 - PCV13) 09/25/1989  . INFLUENZA VACCINE  06/22/2017    Medications: Allergies as of 07/22/2017      Reactions   Morphine And Related Nausea And Vomiting   Pravastatin Sodium    Angina   Tramadol    dizziness   Codeine Rash   Risedronate Rash      Medication List       Accurate as of 07/22/17 12:28 PM. Always use your most recent med list.  atorvastatin 20 MG tablet Commonly known as:  LIPITOR Take 20 mg by mouth daily.   bisacodyl 10 MG suppository Commonly known as:  DULCOLAX Place 10 mg rectally as needed.   donepezil 5 MG tablet Commonly known as:  ARICEPT Take 5 mg by mouth at bedtime.   furosemide 20 MG tablet Commonly known as:  LASIX Take 40 mg by mouth daily.   gabapentin 300 MG capsule Commonly known as:  NEURONTIN Take 300 mg by mouth. Take 3 capsules (900 mg) at bedtime   glucose-Vitamin C 4-0.006 GM Chew chewable tablet Chew 1 tablet by mouth. Take one tablet twice a day for wound hearling   HYDROcodone-acetaminophen 5-325 MG tablet Commonly known as:  NORCO/VICODIN Take 1 tablet by mouth. Take one tablet every 8 hours for pain . Take one tablet  30 minutes prior to wound care   ibandronate 150 MG tablet Commonly known as:  BONIVA Take 150 mg by mouth every 30 (thirty) days. Take in the morning with a full glass of water, on an empty stomach, and do not take anything else by mouth or lie down for the next 30 min.   lisinopril 40 MG tablet Commonly known as:  PRINIVIL,ZESTRIL Take 40 mg by mouth daily.   metoprolol tartrate 25 MG tablet Commonly known as:  LOPRESSOR Take 25 mg by mouth 2 (two) times daily.   ondansetron 4 MG tablet Commonly known as:  ZOFRAN Take 4 mg by mouth every 12 (twelve) hours as needed for nausea.   PROSTATE PO Take by mouth. Give 30 ml twice daily for wound healing   sennosides-docusate sodium 8.6-50 MG tablet Commonly known as:  SENOKOT-S Take 1 tablet by mouth daily.   venlafaxine 37.5 MG tablet Commonly known as:  EFFEXOR Take 37.5 mg by mouth daily.        Vitals:   07/22/17 1045  BP: 126/69  Pulse: 78  Resp: 18  Temp: 99.3 F (37.4 C)  SpO2: (!) 82%  Weight: 177 lb (80.3 kg)  Height: 5\' 2"  (1.575 m)   Body mass index is 32.37 kg/m.  Physical Exam  GENERAL APPEARANCE: Alert, conversant. No acute distress.  HEENT: Unremarkable. RESPIRATORY: Breathing is even, unlabored. Lung sounds are clear   CARDIOVASCULAR: Heart RRR no murmurs, rubs or gallops. No peripheral edema.  GASTROINTESTINAL: Abdomen is soft, non-tender, not distended w/ normal bowel sounds.  NEUROLOGIC: Cranial nerves 2-12 grossly intact. Moves all extremities; Dementia   Labs reviewed: Basic Metabolic Panel:  Recent Labs  45/40/98  NA 140  140  K 3.9  3.9  BUN 26*  26*  CREATININE 0.8  0.8   No results found for: [facility] Liver Function Tests:  Recent Labs  07/01/17  AST 21  21  ALT 20  20  ALKPHOS 91  91   No results for input(s): LIPASE, AMYLASE in the last 8760 hours. No results for input(s): AMMONIA in the last 8760 hours. CBC:  Recent Labs  07/04/17  WBC 12.9  12.9  HGB  12.6  12.6  HCT 39  39  PLT 268  268   Lipid No results for input(s): CHOL, HDL, LDLCALC, TRIG in the last 8760 hours. Cardiac Enzymes: No results for input(s): CKTOTAL, CKMB, CKMBINDEX, TROPONINI in the last 8760 hours. BNP: No results for input(s): BNP in the last 8760 hours. CBG: No results for input(s): GLUCAP in the last 8760 hours.  Procedures and Imaging Studies During Stay: Dg Wrist Complete Left  Result Date: 07/12/2017  CLINICAL DATA:  Fall. EXAM: LEFT WRIST - COMPLETE 3+ VIEW COMPARISON:  None. FINDINGS: There is no evidence of fracture or dislocation. Mild degenerative changes of the triscaphe and first CMC joints. Osteopenia. Soft tissues are unremarkable. IMPRESSION: No acute fracture or malalignment. If there is continued clinical concern for occult scaphoid fracture, recommend follow up x-rays in 10-14 days. Electronically Signed   By: Obie Dredge M.D.   On: 07/12/2017 14:21   Dg Wrist Complete Right  Result Date: 07/12/2017 CLINICAL DATA:  Fall. EXAM: RIGHT WRIST - COMPLETE 3+ VIEW COMPARISON:  None. FINDINGS: There is no evidence of fracture or dislocation. Mild degenerative changes of the triscaphe and first CMC joints. Osteopenia. Soft tissues are unremarkable. IMPRESSION: No acute fracture or malalignment. If there is continued clinical concern for occult scaphoid fracture, recommend follow up x-rays in 10-14 days. Electronically Signed   By: Obie Dredge M.D.   On: 07/12/2017 14:19   Ct Head Wo Contrast  Result Date: 07/12/2017 CLINICAL DATA:  Pain following fall EXAM: CT HEAD WITHOUT CONTRAST CT CERVICAL SPINE WITHOUT CONTRAST TECHNIQUE: Multidetector CT imaging of the head and cervical spine was performed following the standard protocol without intravenous contrast. Multiplanar CT image reconstructions of the cervical spine were also generated. COMPARISON:  None. FINDINGS: CT HEAD FINDINGS Brain: There is moderate diffuse atrophy. There is no intracranial  mass, hemorrhage, extra-axial fluid collection, or midline shift. There is small vessel disease throughout the centra semiovale bilaterally. No acute infarct evident. Vascular: There is no hyperdense vessel. There is calcification in both carotid siphon regions. Skull: Bony calvarium appears intact. There is a right frontal scalp hematoma. Sinuses/Orbits: There is opacification throughout much of the right maxillary antrum with calcification within the opacification in the right maxillary antrum. There is mucosal thickening in multiple ethmoid air cells. There is opacification in anterior left ethmoid air cell. Other paranasal sinuses are clear. Orbits appear symmetric bilaterally. Other: Mastoid air cells are clear. There is debris in the right external auditory canal. CT CERVICAL SPINE FINDINGS Alignment: There is 1 mm of anterolisthesis is C4 on C5. There is 3 mm of anterolisthesis of C5 on C6. There is 1 mm of retrolisthesis of C6 on C7. Skull base and vertebrae: Skull base and craniocervical junction regions appear normal. There is anterior fusion at C3 and C4 with support hardware intact. There is no appreciable fracture. There are no blastic or lytic bone lesions. Soft tissues and spinal canal: The prevertebral soft tissues and predental space regions are within normal limits. There are no paraspinous lesions. No cord or canal hematoma is evident. Disc levels: There is a disc spacer at C3-4. There is marked disc space narrowing at C6-7 and T1-2. There is moderate disc space narrowing at C4-5 and C5-6. There is milder disc space narrowing at C2-3. There are anterior osteophytes at all levels. There is facet osteoarthritic change to varying degrees at all levels bilaterally. There is moderate exit foraminal narrowing due to bony hypertrophy at C3-4 on the right,, at C5-6 on the left, and at C6-7 on the right. No frank disc extrusion. There is moderate spinal stenosis at C6-7 due to bony hypertrophy. Upper  chest: Visualized lung regions are clear. There is aortic atherosclerosis. Other: None IMPRESSION: CT head: 1. Moderate diffuse atrophy with small vessel disease throughout the periventricular white matter. No intracranial mass, hemorrhage, or extra-axial fluid collection. No evident acute infarct. 2.  Areas of arterial vascular calcification. 3. Paranasal sinus disease, most notably in the  right maxillary antrum. Calcification in this area of opacity in the right maxillary antrum suggests chronicity, likely with fungal colonization. 4.  Probable cerumen in the right external auditory canal. 5.  Right frontal scalp hematoma. CT cervical spine: No fracture. Areas of mild spondylolisthesis are felt to be due to underlying spondylosis. There is multilevel arthropathy. There is a degree of spinal stenosis at C6-7 due to bony hypertrophy. Postoperative change noted anteriorly at C3 and C4. There is aortic atherosclerosis. Aortic Atherosclerosis (ICD10-I70.0). Electronically Signed   By: Bretta Bang III M.D.   On: 07/12/2017 14:33   Ct Cervical Spine Wo Contrast  Result Date: 07/12/2017 CLINICAL DATA:  Pain following fall EXAM: CT HEAD WITHOUT CONTRAST CT CERVICAL SPINE WITHOUT CONTRAST TECHNIQUE: Multidetector CT imaging of the head and cervical spine was performed following the standard protocol without intravenous contrast. Multiplanar CT image reconstructions of the cervical spine were also generated. COMPARISON:  None. FINDINGS: CT HEAD FINDINGS Brain: There is moderate diffuse atrophy. There is no intracranial mass, hemorrhage, extra-axial fluid collection, or midline shift. There is small vessel disease throughout the centra semiovale bilaterally. No acute infarct evident. Vascular: There is no hyperdense vessel. There is calcification in both carotid siphon regions. Skull: Bony calvarium appears intact. There is a right frontal scalp hematoma. Sinuses/Orbits: There is opacification throughout much of the  right maxillary antrum with calcification within the opacification in the right maxillary antrum. There is mucosal thickening in multiple ethmoid air cells. There is opacification in anterior left ethmoid air cell. Other paranasal sinuses are clear. Orbits appear symmetric bilaterally. Other: Mastoid air cells are clear. There is debris in the right external auditory canal. CT CERVICAL SPINE FINDINGS Alignment: There is 1 mm of anterolisthesis is C4 on C5. There is 3 mm of anterolisthesis of C5 on C6. There is 1 mm of retrolisthesis of C6 on C7. Skull base and vertebrae: Skull base and craniocervical junction regions appear normal. There is anterior fusion at C3 and C4 with support hardware intact. There is no appreciable fracture. There are no blastic or lytic bone lesions. Soft tissues and spinal canal: The prevertebral soft tissues and predental space regions are within normal limits. There are no paraspinous lesions. No cord or canal hematoma is evident. Disc levels: There is a disc spacer at C3-4. There is marked disc space narrowing at C6-7 and T1-2. There is moderate disc space narrowing at C4-5 and C5-6. There is milder disc space narrowing at C2-3. There are anterior osteophytes at all levels. There is facet osteoarthritic change to varying degrees at all levels bilaterally. There is moderate exit foraminal narrowing due to bony hypertrophy at C3-4 on the right,, at C5-6 on the left, and at C6-7 on the right. No frank disc extrusion. There is moderate spinal stenosis at C6-7 due to bony hypertrophy. Upper chest: Visualized lung regions are clear. There is aortic atherosclerosis. Other: None IMPRESSION: CT head: 1. Moderate diffuse atrophy with small vessel disease throughout the periventricular white matter. No intracranial mass, hemorrhage, or extra-axial fluid collection. No evident acute infarct. 2.  Areas of arterial vascular calcification. 3. Paranasal sinus disease, most notably in the right maxillary  antrum. Calcification in this area of opacity in the right maxillary antrum suggests chronicity, likely with fungal colonization. 4.  Probable cerumen in the right external auditory canal. 5.  Right frontal scalp hematoma. CT cervical spine: No fracture. Areas of mild spondylolisthesis are felt to be due to underlying spondylosis. There is multilevel arthropathy. There is  a degree of spinal stenosis at C6-7 due to bony hypertrophy. Postoperative change noted anteriorly at C3 and C4. There is aortic atherosclerosis. Aortic Atherosclerosis (ICD10-I70.0). Electronically Signed   By: William  Woodruff III M.D.   On: 08/21/2Bretta Bang018 14:33   Dg Hips Bilat W Or Wo Pelvis 3-4 Views  Result Date: 07/12/2017 CLINICAL DATA:  Bilateral hip pain after falling at home today. EXAM: DG HIP (WITH OR WITHOUT PELVIS) 3-4V BILAT COMPARISON:  Lower abdomen and pelvis radiograph from an acute abdominal series dated April 22, 2017. FINDINGS: The bones are subjectively osteopenic. No acute pelvic fracture is observed. On the right the patient has undergone previous ORIF for a femoral neck fracture. Shortening of the femoral neck is observed. No acute fracture is demonstrated. There is mild fairly symmetric narrowing of the right hip joint space. On the left there is mild-to-moderate of the hip joint space. The articular surfaces of the femoral head and acetabulum remains smoothly rounded. The left femoral neck, intertrochanteric, and subtrochanteric regions are normal. IMPRESSION: Chronic post traumatic and post ORIF changes of the right femur. Moderate degenerative narrowing of the hip joint spaces. No acute fracture nor dislocation is observed. Electronically Signed   By: David  SwazilandJordan M.D.   On: 07/12/2017 14:18    Assessment/Plan:   Acute respiratory failure with hypoxia (HCC)  Community acquired pneumonia of left lower lobe of lung (HCC)  Chronic systolic congestive heart failure (HCC)  Hypertensive heart disease with heart  failure (HCC)  Late onset Alzheimer's disease without behavioral disturbance  Polyneuropathy  Age-related osteoporosis without current pathological fracture  Chronic midline low back pain without sciatica  Depression, unspecified depression type  Hyperlipidemia associated with type 2 diabetes mellitus (HCC)   Patient is being discharged with the following home health services:  OT/PT/nursing  Patient is being discharged with the following durable medical equipment:  None  Patient has been advised to f/u with their PCP in 1-2 weeks to bring them up to date on their rehab stay.  Social services at facility was responsible for arranging this appointment.  Pt was provided with a 30 day supply of prescriptions for medications and refills must be obtained from their PCP.  For controlled substances, a more limited supply may be provided adequate until PCP appointment only.  Medications have been reconciled.  Time spent greater than 30 minutes;> 50% of time with patient was spent reviewing records, labs, tests and studies, counseling and developing plan of care  Randon Goldsmithnne D. Lyn HollingsheadAlexander, MD

## 2017-07-28 ENCOUNTER — Encounter: Payer: Self-pay | Admitting: Internal Medicine

## 2018-02-20 DEATH — deceased

## 2018-04-20 IMAGING — CT CT CERVICAL SPINE W/O CM
5 of 8 series · 12 of 33 positions shown, 13 images · non-contrast
Comparison: None.

CLINICAL DATA: Pain following fall

EXAM:
CT HEAD WITHOUT CONTRAST
CT CERVICAL SPINE WITHOUT CONTRAST
TECHNIQUE: Multidetector CT imaging of the head and cervical spine was
performed following the standard protocol without intravenous
contrast. Multiplanar CT image reconstructions of the cervical spine
were also generated.

[Series 5: head bone · axial · 0.40mm/px · z∈[-126,-72]mm · 2 of 81 slices shown]
[im 27/81  bone]
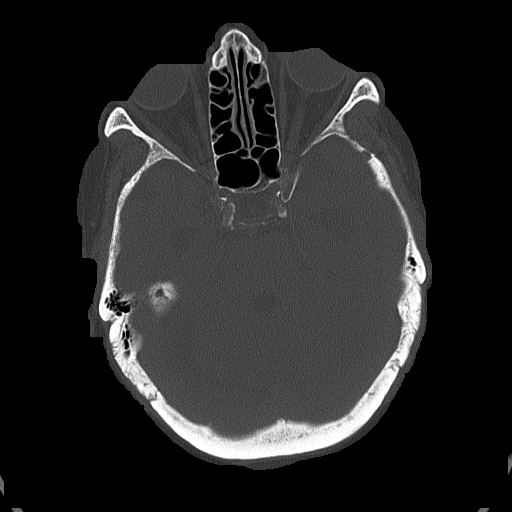
[im 54/81  bone]
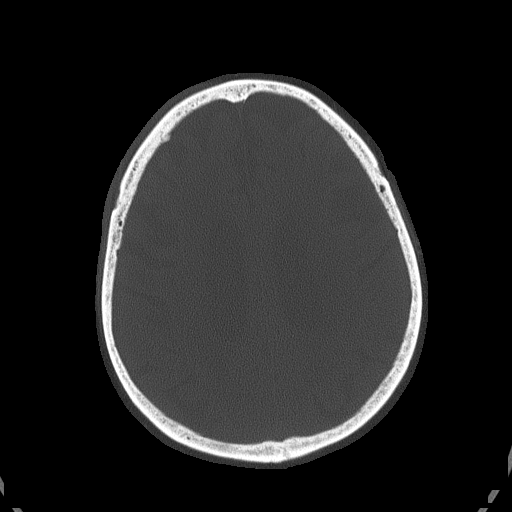

[Series 8: c_spine 2.0 st · axial · 0.28mm/px · z∈[-296,-196]mm · 3 of 102 slices shown, 4 images]
[im 26/102  soft-tissue]
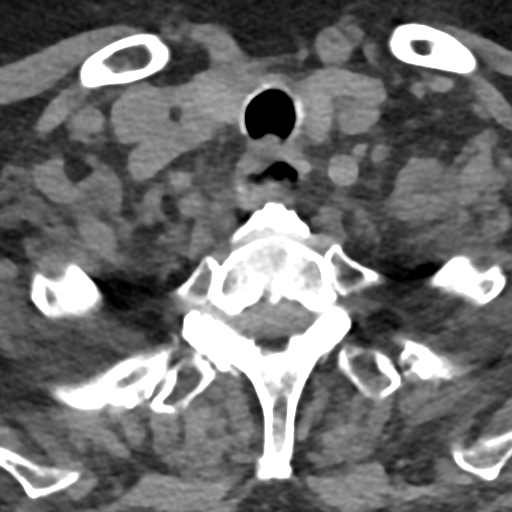
[im 26/102  bone]
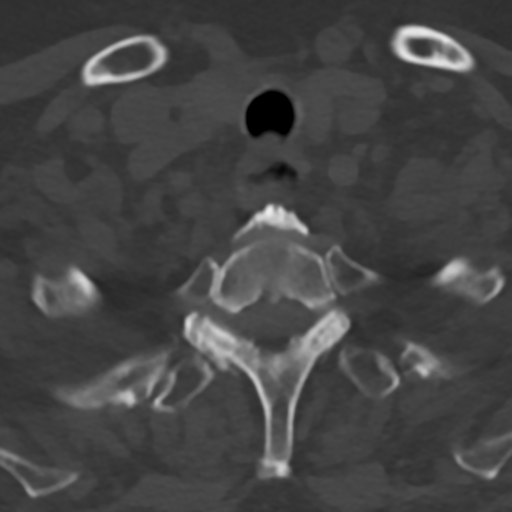
[im 51/102  bone]
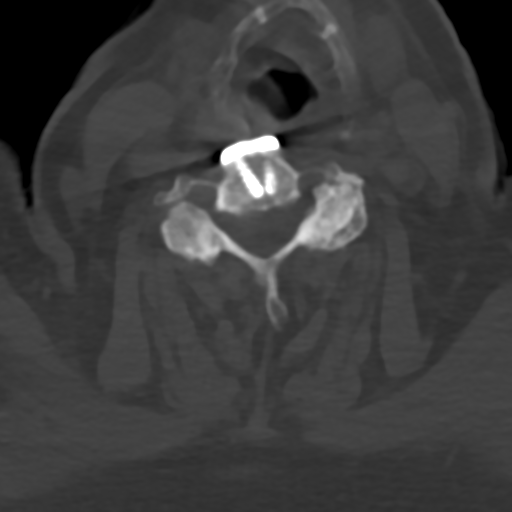
[im 76/102  bone]
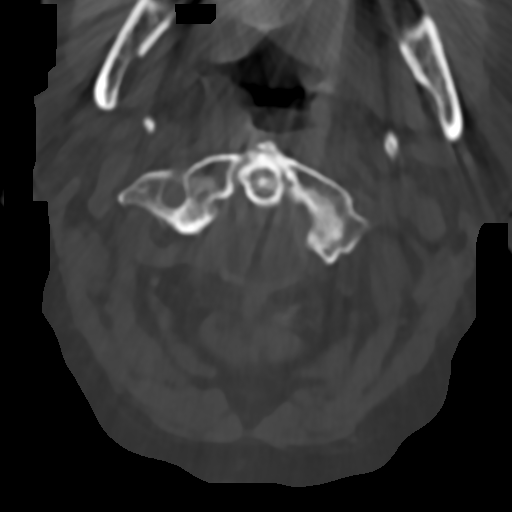

[Series 10: c_spine 2.0 sag bone · sagittal · 0.26mm/px · 4 of 52 slices shown]
[im 11/52  bone]
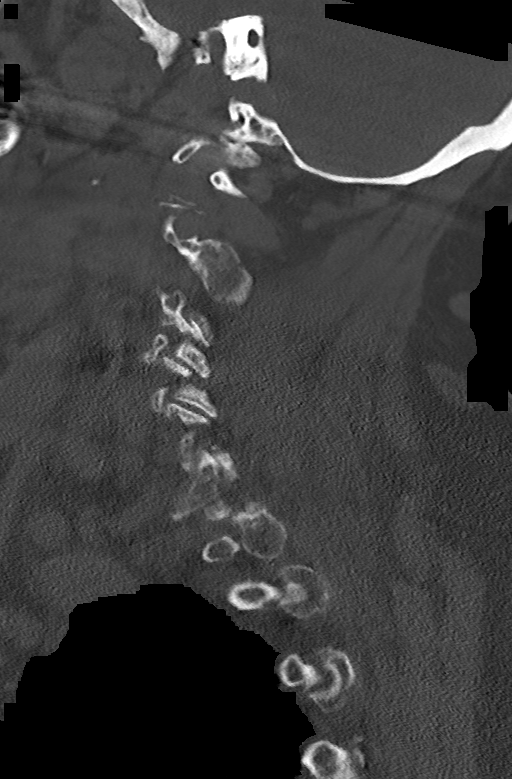
[im 21/52  bone]
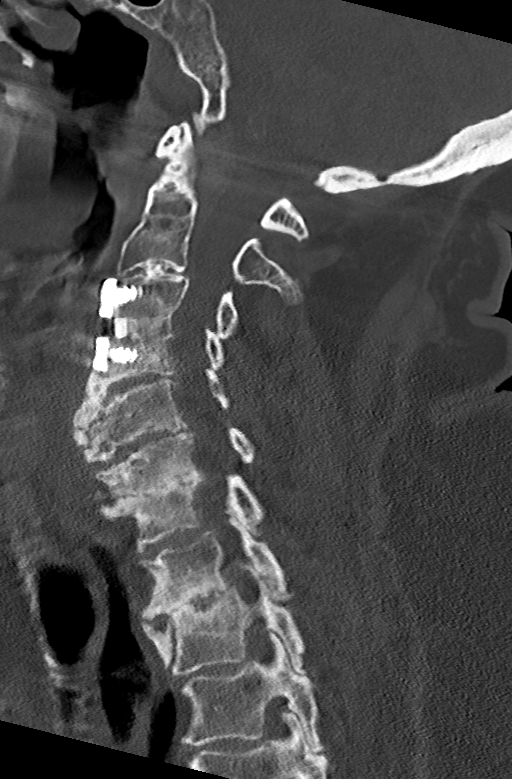
[im 31/52  bone]
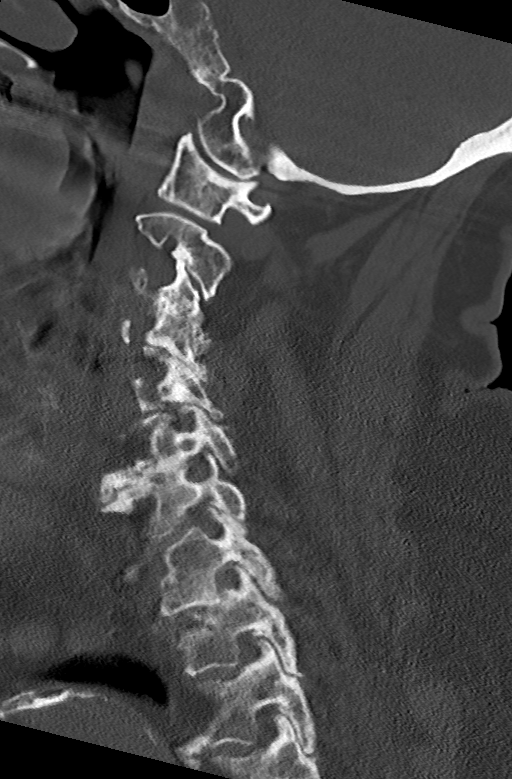
[im 41/52  bone]
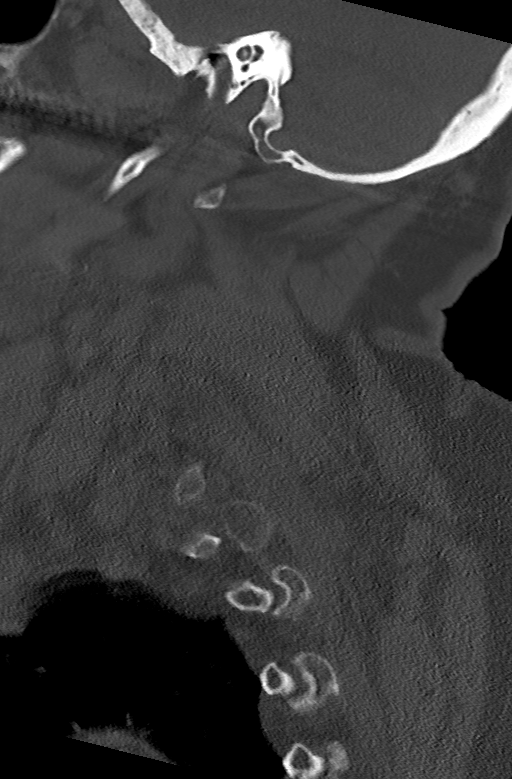

[Series 11: c_spine 2.0 cor bone · coronal · 0.22mm/px · 1 of 61 slices shown]
[im 31/61  bone]
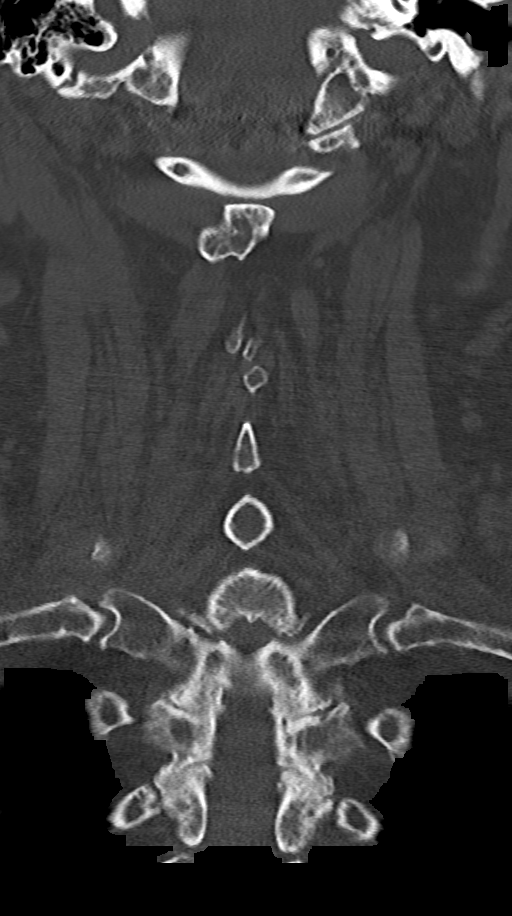

[Series 12: c_spine 2.0 orthogonals · axial · 0.21mm/px · z∈[-312,-242]mm · 2 of 96 slices shown]
[im 32/96  bone]
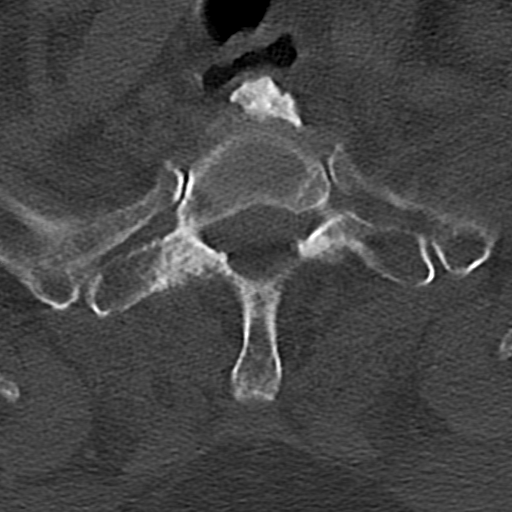
[im 64/96  bone]
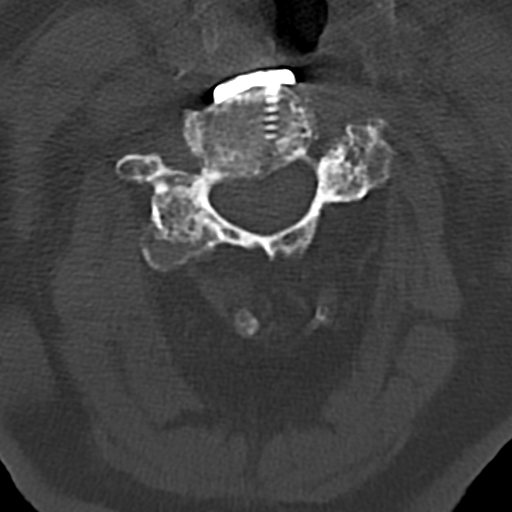

[12 of 33 positions shown; findings below may reference images not displayed]

FINDINGS: CT HEAD FINDINGS

Brain: There is moderate diffuse atrophy. There is no intracranial
mass, hemorrhage, extra-axial fluid collection, or midline shift.
There is small vessel disease throughout the centra semiovale
bilaterally. No acute infarct evident.

Vascular: There is no hyperdense vessel. There is calcification in
both carotid siphon regions.

Skull: Bony calvarium appears intact. There is a right frontal scalp
hematoma.

Sinuses/Orbits: There is opacification throughout much of the right
maxillary antrum with calcification within the opacification in the
right maxillary antrum. There is mucosal thickening in multiple
ethmoid air cells. There is opacification in anterior left ethmoid
air cell. Other paranasal sinuses are clear. Orbits appear symmetric
bilaterally.

Other: Mastoid air cells are clear. There is debris in the right
external auditory canal.

CT CERVICAL SPINE FINDINGS

Alignment: There is 1 mm of anterolisthesis is C4 on C5. There is 3
mm of anterolisthesis of C5 on C6. There is 1 mm of retrolisthesis
of C6 on C7.

Skull base and vertebrae: Skull base and craniocervical junction
regions appear normal. There is anterior fusion at C3 and C4 with
support hardware intact. There is no appreciable fracture. There are
no blastic or lytic bone lesions.

Soft tissues and spinal canal: The prevertebral soft tissues and
predental space regions are within normal limits. There are no
paraspinous lesions. No cord or canal hematoma is evident.

Disc levels: There is a disc spacer at C3-4. There is marked disc
space narrowing at C6-7 and T1-2. There is moderate disc space
narrowing at C4-5 and C5-6. There is milder disc space narrowing at
C2-3. There are anterior osteophytes at all levels. There is facet
osteoarthritic change to varying degrees at all levels bilaterally.
There is moderate exit foraminal narrowing due to bony hypertrophy
at C3-4 on the right,, at C5-6 on the left, and at C6-7 on the
right. No frank disc extrusion. There is moderate spinal stenosis at
C6-7 due to bony hypertrophy.

Upper chest: Visualized lung regions are clear. There is aortic
atherosclerosis.

Other: None
IMPRESSION: CT head:

1. Moderate diffuse atrophy with small vessel disease throughout the
periventricular white matter. No intracranial mass, hemorrhage, or
extra-axial fluid collection. No evident acute infarct.

2.  Areas of arterial vascular calcification.

3. Paranasal sinus disease, most notably in the right maxillary
antrum. Calcification in this area of opacity in the right maxillary
antrum suggests chronicity, likely with fungal colonization.

4.  Probable cerumen in the right external auditory canal.

5.  Right frontal scalp hematoma.

CT cervical spine: No fracture. Areas of mild spondylolisthesis are
felt to be due to underlying spondylosis.

There is multilevel arthropathy. There is a degree of spinal
stenosis at C6-7 due to bony hypertrophy. Postoperative change noted
anteriorly at C3 and C4.

There is aortic atherosclerosis.

Aortic Atherosclerosis ([id number]).
